# Patient Record
Sex: Male | Born: 1990 | Race: Black or African American | Hispanic: No | Marital: Single | Smoking: Current every day smoker
Health system: Southern US, Community
[De-identification: ages and names within clinical notes are randomized; demographics above are authoritative.]

---

## 2014-03-29 ENCOUNTER — Emergency Department (HOSPITAL_COMMUNITY): Payer: No Typology Code available for payment source

## 2014-03-29 ENCOUNTER — Encounter (HOSPITAL_COMMUNITY): Payer: Self-pay | Admitting: Emergency Medicine

## 2014-03-29 ENCOUNTER — Emergency Department (HOSPITAL_COMMUNITY)
Admission: EM | Admit: 2014-03-29 | Discharge: 2014-03-29 | Disposition: A | Discharge status: Home / Self Care | Payer: No Typology Code available for payment source | Attending: Emergency Medicine | Admitting: Emergency Medicine

## 2014-03-29 DIAGNOSIS — S46909A Unspecified injury of unspecified muscle, fascia and tendon at shoulder and upper arm level, unspecified arm, initial encounter: Secondary | ICD-10-CM | POA: Insufficient documentation

## 2014-03-29 DIAGNOSIS — M549 Dorsalgia, unspecified: Secondary | ICD-10-CM

## 2014-03-29 DIAGNOSIS — S199XXA Unspecified injury of neck, initial encounter: Principal | ICD-10-CM

## 2014-03-29 DIAGNOSIS — Y92410 Unspecified street and highway as the place of occurrence of the external cause: Secondary | ICD-10-CM | POA: Insufficient documentation

## 2014-03-29 DIAGNOSIS — S0993XA Unspecified injury of face, initial encounter: Secondary | ICD-10-CM | POA: Insufficient documentation

## 2014-03-29 DIAGNOSIS — M542 Cervicalgia: Secondary | ICD-10-CM

## 2014-03-29 DIAGNOSIS — Y9389 Activity, other specified: Secondary | ICD-10-CM | POA: Insufficient documentation

## 2014-03-29 DIAGNOSIS — S4980XA Other specified injuries of shoulder and upper arm, unspecified arm, initial encounter: Secondary | ICD-10-CM | POA: Insufficient documentation

## 2014-03-29 DIAGNOSIS — F17200 Nicotine dependence, unspecified, uncomplicated: Secondary | ICD-10-CM | POA: Insufficient documentation

## 2014-03-29 DIAGNOSIS — IMO0002 Reserved for concepts with insufficient information to code with codable children: Secondary | ICD-10-CM | POA: Insufficient documentation

## 2014-03-29 DIAGNOSIS — M25512 Pain in left shoulder: Secondary | ICD-10-CM

## 2014-03-29 MED ORDER — IBUPROFEN 800 MG PO TABS
800.0000 mg | ORAL_TABLET | Freq: Three times a day (TID) | ORAL | Status: DC
Start: 2014-03-29 — End: 2017-01-04

## 2014-03-29 MED ORDER — CYCLOBENZAPRINE HCL 5 MG PO TABS: 5.0000 mg | ORAL_TABLET | Freq: Three times a day (TID) | ORAL | Status: DC | PRN

## 2014-03-29 MED ORDER — IBUPROFEN 800 MG PO TABS
800.0000 mg | ORAL_TABLET | Freq: Once | ORAL | Status: AC
  Administered 2014-03-29: 800 mg via ORAL
  Filled 2014-03-29: qty 1
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
## 2014-04-02 NOTE — ED Provider Notes (Signed)
Medical screening examination/treatment/procedure(s) were performed by non-physician practitioner and as supervising physician I was immediately available for consultation/collaboration.   EKG Interpretation None        Lyanne CoKevin M Margurite Duffy, MD 04/02/14 1529
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
## 2017-01-04 ENCOUNTER — Ambulatory Visit (INDEPENDENT_AMBULATORY_CARE_PROVIDER_SITE_OTHER): Payer: BLUE CROSS/BLUE SHIELD | Admitting: Podiatry

## 2017-01-04 ENCOUNTER — Ambulatory Visit (INDEPENDENT_AMBULATORY_CARE_PROVIDER_SITE_OTHER): Payer: BLUE CROSS/BLUE SHIELD

## 2017-01-04 DIAGNOSIS — L84 Corns and callosities: Secondary | ICD-10-CM

## 2017-01-04 DIAGNOSIS — M79673 Pain in unspecified foot: Secondary | ICD-10-CM

## 2017-01-05 ENCOUNTER — Telehealth: Payer: Self-pay | Admitting: Podiatry
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
## 2017-01-05 NOTE — Telephone Encounter (Signed)
I instructed pt to wash the medication off the feet and apply light coating of Neosporin and cover with a bandaid, and I would write him out of work for today. Pt states understanding and will pick up the note in the [place] office today.

## 2017-01-06 NOTE — Progress Notes (Signed)
   Subjective: Patient presents to the office today for chief complaint of throbbing pain secondary to callus lesions to the plantar aspect of bilateral great toes that have been present for more than 1 year. Patient states that the pain is ongoing and is affecting their ability to ambulate without pain. He states he wears steel toed boots and work. Patient presents today for further treatment and evaluation.  Objective:  Physical Exam General: Alert and oriented x3 in no acute distress  Dermatology: Hyperkeratotic lesion present on the bilateral great toes. Pain on palpation with a central nucleated core noted.  Skin is warm, dry and supple bilateral lower extremities. Negative for open lesions or macerations.  Vascular: Palpable pedal pulses bilaterally. No edema or erythema noted. Capillary refill within normal limits.  Neurological: Epicritic and protective threshold grossly intact bilaterally.   Musculoskeletal Exam: Pain on palpation at the keratotic lesion noted. Range of motion within normal limits bilateral. Muscle strength 5/5 in all groups bilateral.  Radiographic Exam: Subtle interphalangeal sesamoid noted to the bilateral great toes likely contributory to the overlying callus lesions. Joint spaces are preserved. No degenerative changes. No fractures noted.  Assessment: #1 bilateral great toe porokeratosis   Plan of Care:  #1 Patient evaluated #2 Excisional debridement of keratoic lesion using a chisel blade was performed without incident.  #3 Treated area(s) with Salinocaine and dressed with light dressing. #4 Patient is to return to the clinic PRN.   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  Felecia Shelling, DPM [facility]  Dr. Felecia Shelling, DPM    [address]                                        [place], [place] [address]                Office [phone]  Fax [phone]

## 2017-03-03 ENCOUNTER — Ambulatory Visit: Payer: BLUE CROSS/BLUE SHIELD | Admitting: Podiatry

## 2017-03-10 ENCOUNTER — Ambulatory Visit: Payer: Self-pay | Admitting: Podiatry

## 2017-03-31 ENCOUNTER — Ambulatory Visit: Payer: Self-pay | Admitting: Podiatry
